# Patient Record
Sex: Female | Born: 1945 | Race: White | Hispanic: No | Marital: Married | Smoking: Never smoker
Health system: Southern US, Community
[De-identification: ages and names within clinical notes are randomized; demographics above are authoritative.]

## PROBLEM LIST (undated history)

## (undated) DIAGNOSIS — K449 Diaphragmatic hernia without obstruction or gangrene: Secondary | ICD-10-CM

## (undated) DIAGNOSIS — K219 Gastro-esophageal reflux disease without esophagitis: Secondary | ICD-10-CM

## (undated) DIAGNOSIS — I4891 Unspecified atrial fibrillation: Secondary | ICD-10-CM

## (undated) DIAGNOSIS — F419 Anxiety disorder, unspecified: Secondary | ICD-10-CM

## (undated) DIAGNOSIS — R51 Headache: Secondary | ICD-10-CM

## (undated) DIAGNOSIS — E785 Hyperlipidemia, unspecified: Secondary | ICD-10-CM

## (undated) DIAGNOSIS — I10 Essential (primary) hypertension: Secondary | ICD-10-CM

## (undated) DIAGNOSIS — N189 Chronic kidney disease, unspecified: Secondary | ICD-10-CM

## (undated) DIAGNOSIS — Z87442 Personal history of urinary calculi: Secondary | ICD-10-CM

## (undated) HISTORY — DX: Diaphragmatic hernia without obstruction or gangrene: K44.9

## (undated) HISTORY — PX: BREAST BIOPSY: SHX20

## (undated) HISTORY — PX: GALLBLADDER SURGERY: SHX652

## (undated) HISTORY — DX: Hyperlipidemia, unspecified: E78.5

## (undated) HISTORY — PX: WISDOM TOOTH EXTRACTION: SHX21

## (undated) HISTORY — DX: Personal history of urinary calculi: Z87.442

## (undated) HISTORY — DX: Gastro-esophageal reflux disease without esophagitis: K21.9

## (undated) HISTORY — PX: CHOLECYSTECTOMY: SHX55

## (undated) HISTORY — DX: Unspecified atrial fibrillation: I48.91

## (undated) HISTORY — PX: CYSTOSCOPY W/ LITHOLAPAXY / EHL: SUR377

## (undated) HISTORY — DX: Essential (primary) hypertension: I10

---

## 1972-10-17 HISTORY — PX: OTHER SURGICAL HISTORY: SHX169

## 1998-10-17 HISTORY — PX: OTHER SURGICAL HISTORY: SHX169

## 1998-10-17 HISTORY — PX: UPPER GASTROINTESTINAL ENDOSCOPY: SHX188

## 1998-11-25 ENCOUNTER — Ambulatory Visit (HOSPITAL_COMMUNITY): Admission: RE | Admit: 1998-11-25 | Discharge: 1998-11-25 | Payer: Self-pay | Admitting: General Surgery

## 1998-11-25 ENCOUNTER — Encounter: Payer: Self-pay | Admitting: General Surgery

## 1998-12-01 ENCOUNTER — Encounter: Payer: Self-pay | Admitting: Gastroenterology

## 1998-12-01 ENCOUNTER — Ambulatory Visit (HOSPITAL_COMMUNITY): Admission: RE | Admit: 1998-12-01 | Discharge: 1998-12-01 | Payer: Self-pay | Admitting: Gastroenterology

## 1998-12-10 ENCOUNTER — Inpatient Hospital Stay (HOSPITAL_COMMUNITY): Admission: RE | Admit: 1998-12-10 | Discharge: 1998-12-15 | Payer: Self-pay | Admitting: General Surgery

## 1999-01-25 ENCOUNTER — Encounter: Payer: Self-pay | Admitting: General Surgery

## 1999-01-25 ENCOUNTER — Ambulatory Visit (HOSPITAL_COMMUNITY): Admission: RE | Admit: 1999-01-25 | Discharge: 1999-01-25 | Payer: Self-pay | Admitting: General Surgery

## 2000-02-07 ENCOUNTER — Other Ambulatory Visit: Admission: RE | Admit: 2000-02-07 | Discharge: 2000-02-07 | Payer: Self-pay | Admitting: General Surgery

## 2005-04-16 HISTORY — PX: OTHER SURGICAL HISTORY: SHX169

## 2011-03-03 ENCOUNTER — Other Ambulatory Visit (INDEPENDENT_AMBULATORY_CARE_PROVIDER_SITE_OTHER): Payer: Self-pay | Admitting: General Surgery

## 2011-03-03 DIAGNOSIS — R1900 Intra-abdominal and pelvic swelling, mass and lump, unspecified site: Secondary | ICD-10-CM

## 2011-03-09 ENCOUNTER — Ambulatory Visit
Admission: RE | Admit: 2011-03-09 | Discharge: 2011-03-09 | Disposition: A | Discharge status: Home / Self Care | Payer: Medicare Other | Source: Ambulatory Visit | Attending: General Surgery | Admitting: General Surgery

## 2011-03-09 DIAGNOSIS — R1900 Intra-abdominal and pelvic swelling, mass and lump, unspecified site: Secondary | ICD-10-CM

## 2011-03-09 MED ORDER — IOHEXOL 300 MG/ML  SOLN
100.0000 mL | Freq: Once | INTRAMUSCULAR | Status: AC | PRN
  Administered 2011-03-09: 100 mL via INTRAVENOUS

## 2011-03-12 ENCOUNTER — Other Ambulatory Visit: Payer: Self-pay | Admitting: Cardiovascular Disease

## 2011-03-15 ENCOUNTER — Other Ambulatory Visit: Payer: Self-pay | Admitting: *Deleted

## 2011-03-15 ENCOUNTER — Other Ambulatory Visit: Payer: Self-pay | Admitting: Cardiovascular Disease

## 2011-03-15 MED ORDER — NEBIVOLOL HCL 10 MG PO TABS: 10.0000 mg | ORAL_TABLET | Freq: Every day | ORAL | Status: DC

## 2011-03-15 NOTE — Telephone Encounter (Signed)
CALL PT, SHE HAS BEEN TRYING TO FILL HER BP MED AND NOT SURE IF ITS TIME FOR HER TO COME BACK IN FOR AN OV. CHART IN BOX.

## 2011-03-15 NOTE — Telephone Encounter (Signed)
Pt having lab work done at PCP tomorrow and will forward lab work to [place]. Pt will call back to set up app with in next two months. Script refilled. Alfonso Ramus RN

## 2011-03-15 NOTE — Telephone Encounter (Signed)
Pt called needs app./ made.Alfonso Ramus RN

## 2011-04-08 ENCOUNTER — Encounter: Payer: Self-pay | Admitting: *Deleted

## 2011-04-08 DIAGNOSIS — K449 Diaphragmatic hernia without obstruction or gangrene: Secondary | ICD-10-CM | POA: Insufficient documentation

## 2011-04-08 DIAGNOSIS — I4891 Unspecified atrial fibrillation: Secondary | ICD-10-CM | POA: Insufficient documentation

## 2011-04-08 DIAGNOSIS — Z87442 Personal history of urinary calculi: Secondary | ICD-10-CM | POA: Insufficient documentation

## 2011-04-08 DIAGNOSIS — K219 Gastro-esophageal reflux disease without esophagitis: Secondary | ICD-10-CM | POA: Insufficient documentation

## 2011-04-15 ENCOUNTER — Encounter: Payer: Self-pay | Admitting: Cardiovascular Disease

## 2011-04-15 ENCOUNTER — Ambulatory Visit (INDEPENDENT_AMBULATORY_CARE_PROVIDER_SITE_OTHER): Payer: Medicare Other | Admitting: Cardiovascular Disease

## 2011-04-15 VITALS — BP 122/92 | HR 63 | Ht 62.5 in | Wt 163.4 lb

## 2011-04-15 DIAGNOSIS — I4891 Unspecified atrial fibrillation: Secondary | ICD-10-CM

## 2011-04-15 MED ORDER — NIACIN ER (ANTIHYPERLIPIDEMIC) 1000 MG PO TBCR: 1000.0000 mg | EXTENDED_RELEASE_TABLET | Freq: Every day | ORAL | Status: DC

## 2011-04-15 MED ORDER — NEBIVOLOL HCL 10 MG PO TABS: 10.0000 mg | ORAL_TABLET | Freq: Every day | ORAL | Status: DC

## 2011-04-15 NOTE — Progress Notes (Signed)
Savannah Tran Date of Birth  1946-01-21 [facility] [address]     [address] [place], [place]  [address] [phone]  Fax  [phone]  History of Present Illness:  65 yo with interminttant A-Fib.  Hypercholesterolemia.  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  No chest pain or dyspnea with exercise.   Hx of HTN . BP is getting better with exercise  Current Outpatient Prescriptions on File Prior to Visit  Medication Sig Dispense Refill  . aspirin 81 MG tablet Take 81 mg by mouth daily. Taking 2 at HS       . atorvastatin (LIPITOR) 20 MG tablet Take 20 mg by mouth daily.        Marland Kitchen BYSTOLIC 10 MG tablet TAKE 1 TABLET BY MOUTH DAILY  90 tablet  1  . Calcium Carbonate-Vitamin D (CALCIUM 600 + D PO) Take by mouth. Taking 2 daily       . fluticasone (VERAMYST) 27.5 MCG/SPRAY nasal spray Place 2 sprays into the nose daily.        . niacin (NIASPAN) 500 MG CR tablet Take 1,000 mg by mouth at bedtime.       . Omega-3 Fatty Acids (FISH OIL PO) Take by mouth.        . DISCONTD: meloxicam (MOBIC) 15 MG tablet Take 15 mg by mouth daily. Taking as needed         Allergies  Allergen Reactions  . Erythromycin     GI issues    Past Medical History  Diagnosis Date  . Atrial fibrillation with rapid ventricular response   . Hyperlipidemia   . Thyroid disease   . Hypertension   . Gastroesophageal reflux disease   . Hiatal hernia   . History of kidney stones     Past Surgical History  Procedure Date  . Gallbladder surgery 1995 or 1996    History  Smoking status  . Never Smoker   Smokeless tobacco  . Not on file    History  Alcohol Use No    Family History  Problem Relation Age of Onset  . Heart attack Mother   . Heart disease Father   . Skin cancer Father     Reviw of Systems:  Reviewed in the HPI.  All other systems are negative.  Physical Exam: BP 122/92  Pulse 63  Ht 5'  2.5" (1.588 m)  Wt 163 lb 6.4 oz (74.118 kg)  BMI 29.41 kg/m2 The patient is alert and oriented x 3.  The mood and affect are normal.   Skin: warm and dry.  Color is normal.    HEENT:   the sclera are nonicteric.  The mucous membranes are moist.  The carotids are 2+ without bruits.  There is no thyromegaly.  There is no JVD.    Lungs: clear.  The chest wall is non tender.    Heart: regular rate with a normal S1 and S2.  There are no murmurs, gallops, or rubs. The PMI is not displaced.     Abdomin: good bowel sounds.  There is no guarding or rebound.  There is no hepatosplenomegaly or tenderness.  There are no masses.   Extremities:  no clubbing, cyanosis, or edema.  The legs are without rashes.  The distal pulses are intact.   Neuro:  Cranial nerves II - XII are intact.  Motor and sensory functions are intact.  The gait is normal.  ECG: Normal sinus rhythm. She has no ST or T wave changes.  Assessment / Plan:

## 2011-04-15 NOTE — Assessment & Plan Note (Signed)
Savannah Tran has done very well from a Atrial fibrillation standpoint. She remains in normal sinus rhythm. We'll continue with her same medications and I'll see her again in 6 months.

## 2011-04-18 ENCOUNTER — Encounter: Payer: Self-pay | Admitting: Cardiovascular Disease

## 2011-09-22 ENCOUNTER — Other Ambulatory Visit: Payer: Self-pay | Admitting: Obstetrics and Gynecology

## 2011-09-22 ENCOUNTER — Encounter (HOSPITAL_COMMUNITY): Payer: Self-pay | Admitting: Pharmacist

## 2011-09-26 NOTE — Patient Instructions (Addendum)
   Your procedure is scheduled on: Friday, Dec 14th  Enter through the [employer] of [facility] at: 10:30am Pick up the phone at the desk and dial [phone] and inform us of your arrival.  Please call this number if you have any problems the morning of surgery: [phone]  Remember: Do not eat food after midnight: Thursday Do not drink clear liquids after: 8:00am Friday Take these medicines the morning of surgery with a SIP OF WATER: Lisinopril, Toprol-XL, Prilosec   Do not wear jewelry, make-up, or FINGER nail polish Do not wear lotions, powders, perfumes or deodorant. Do not shave 48 hours prior to surgery. Do not bring valuables to the hospital.  Patients discharged on the day of surgery will not be allowed to drive home.   Home with Margar[facility] - Friend  Remember to use your hibiclens as instructed.Please shower with 1/2 bottle the evening before your surgery and the other 1/2 bottle the morning of surgery.

## 2011-09-27 ENCOUNTER — Encounter (HOSPITAL_COMMUNITY): Payer: Self-pay

## 2011-09-27 ENCOUNTER — Encounter (HOSPITAL_COMMUNITY)
Admission: RE | Admit: 2011-09-27 | Discharge: 2011-09-27 | Disposition: A | Discharge status: Home / Self Care | Payer: Medicare Other | Source: Ambulatory Visit | Attending: Obstetrics and Gynecology | Admitting: Obstetrics and Gynecology

## 2011-09-27 HISTORY — DX: Chronic kidney disease, unspecified: N18.9

## 2011-09-27 HISTORY — DX: Anxiety disorder, unspecified: F41.9

## 2011-09-27 HISTORY — DX: Headache: R51

## 2011-09-27 LAB — BASIC METABOLIC PANEL
Calcium: 9.9 mg/dL (ref 8.4–10.5)
GFR calc non Af Amer: 87 mL/min — ABNORMAL LOW (ref >90)
Sodium: 140 mEq/L (ref 135–145)

## 2011-09-27 LAB — CBC
Platelets: 224 10*3/uL (ref 150–400)
RBC: 4.34 MIL/uL (ref 3.87–5.11)
RDW: 14.6 % (ref 11.5–15.5)
WBC: 6.5 10*3/uL (ref 4.0–10.5)

## 2011-09-27 NOTE — Pre-Procedure Instructions (Signed)
Ok TO see patient DOS

## 2011-09-30 ENCOUNTER — Ambulatory Visit (HOSPITAL_COMMUNITY): Payer: Medicare Other | Admitting: Anesthesiology

## 2011-09-30 ENCOUNTER — Other Ambulatory Visit: Payer: Self-pay | Admitting: Obstetrics and Gynecology

## 2011-09-30 ENCOUNTER — Encounter (HOSPITAL_COMMUNITY): Payer: Self-pay | Admitting: Anesthesiology

## 2011-09-30 ENCOUNTER — Encounter (HOSPITAL_COMMUNITY)
Admission: RE | Disposition: A | Discharge status: Home / Self Care | Payer: Self-pay | Source: Ambulatory Visit | Attending: Obstetrics and Gynecology

## 2011-09-30 ENCOUNTER — Ambulatory Visit (HOSPITAL_COMMUNITY)
Admission: RE | Admit: 2011-09-30 | Discharge: 2011-09-30 | Disposition: A | Discharge status: Home / Self Care | Payer: Medicare Other | Source: Ambulatory Visit | Attending: Obstetrics and Gynecology | Admitting: Obstetrics and Gynecology

## 2011-09-30 DIAGNOSIS — S3760XA Unspecified injury of uterus, initial encounter: Secondary | ICD-10-CM | POA: Insufficient documentation

## 2011-09-30 DIAGNOSIS — Y9210 Unspecified residential institution as the place of occurrence of the external cause: Secondary | ICD-10-CM | POA: Insufficient documentation

## 2011-09-30 DIAGNOSIS — Z01812 Encounter for preprocedural laboratory examination: Secondary | ICD-10-CM | POA: Insufficient documentation

## 2011-09-30 DIAGNOSIS — R9389 Abnormal findings on diagnostic imaging of other specified body structures: Secondary | ICD-10-CM | POA: Insufficient documentation

## 2011-09-30 DIAGNOSIS — IMO0002 Reserved for concepts with insufficient information to code with codable children: Secondary | ICD-10-CM | POA: Insufficient documentation

## 2011-09-30 DIAGNOSIS — Z9889 Other specified postprocedural states: Secondary | ICD-10-CM

## 2011-09-30 DIAGNOSIS — N840 Polyp of corpus uteri: Secondary | ICD-10-CM | POA: Insufficient documentation

## 2011-09-30 DIAGNOSIS — Z01818 Encounter for other preprocedural examination: Secondary | ICD-10-CM | POA: Insufficient documentation

## 2011-09-30 HISTORY — PX: LAPAROSCOPY: SHX197

## 2011-09-30 HISTORY — PX: HYSTEROSCOPY WITH D & C: SHX1775

## 2011-09-30 SURGERY — DILATATION AND CURETTAGE /HYSTEROSCOPY
Anesthesia: General | Site: Vagina

## 2011-09-30 MED ORDER — DEXAMETHASONE SODIUM PHOSPHATE 10 MG/ML IJ SOLN
INTRAMUSCULAR | Status: AC
  Filled 2011-09-30: qty 1

## 2011-09-30 MED ORDER — PROPOFOL 10 MG/ML IV EMUL
INTRAVENOUS | Status: AC
  Filled 2011-09-30: qty 20

## 2011-09-30 MED ORDER — ONDANSETRON HCL 4 MG/2ML IJ SOLN
INTRAMUSCULAR | Status: AC
  Filled 2011-09-30: qty 2

## 2011-09-30 MED ORDER — GLYCOPYRROLATE 0.2 MG/ML IJ SOLN
INTRAMUSCULAR | Status: DC | PRN
  Administered 2011-09-30: .4 mg via INTRAVENOUS

## 2011-09-30 MED ORDER — KETOROLAC TROMETHAMINE 30 MG/ML IJ SOLN
INTRAMUSCULAR | Status: DC | PRN
  Administered 2011-09-30: 30 mg via INTRAVENOUS

## 2011-09-30 MED ORDER — OXYCODONE-ACETAMINOPHEN 5-325 MG PO TABS: 1.0000 | ORAL_TABLET | ORAL | Status: AC | PRN

## 2011-09-30 MED ORDER — OXYCODONE-ACETAMINOPHEN 5-325 MG PO TABS
ORAL_TABLET | ORAL | Status: AC
  Filled 2011-09-30: qty 1

## 2011-09-30 MED ORDER — MIDAZOLAM HCL 5 MG/5ML IJ SOLN
INTRAMUSCULAR | Status: DC | PRN
  Administered 2011-09-30: 2 mg via INTRAVENOUS

## 2011-09-30 MED ORDER — KETOROLAC TROMETHAMINE 30 MG/ML IJ SOLN
INTRAMUSCULAR | Status: AC
  Filled 2011-09-30: qty 1

## 2011-09-30 MED ORDER — GLYCOPYRROLATE 0.2 MG/ML IJ SOLN
INTRAMUSCULAR | Status: AC
  Filled 2011-09-30: qty 1

## 2011-09-30 MED ORDER — NEOSTIGMINE METHYLSULFATE 1 MG/ML IJ SOLN
INTRAMUSCULAR | Status: DC | PRN
  Administered 2011-09-30: 3 mg via INTRAVENOUS

## 2011-09-30 MED ORDER — LACTATED RINGERS IV SOLN
INTRAVENOUS | Status: DC
  Administered 2011-09-30 (×3): via INTRAVENOUS

## 2011-09-30 MED ORDER — ROCURONIUM BROMIDE 50 MG/5ML IV SOLN
INTRAVENOUS | Status: AC
  Filled 2011-09-30: qty 1

## 2011-09-30 MED ORDER — GLYCINE 1.5 % IR SOLN
Status: DC | PRN
  Administered 2011-09-30: 3000 mL

## 2011-09-30 MED ORDER — MIDAZOLAM HCL 2 MG/2ML IJ SOLN
INTRAMUSCULAR | Status: AC
  Filled 2011-09-30: qty 2

## 2011-09-30 MED ORDER — LACTATED RINGERS IV SOLN: INTRAVENOUS | Status: DC

## 2011-09-30 MED ORDER — LIDOCAINE HCL (CARDIAC) 20 MG/ML IV SOLN
INTRAVENOUS | Status: AC
  Filled 2011-09-30: qty 5

## 2011-09-30 MED ORDER — DEXAMETHASONE SODIUM PHOSPHATE 10 MG/ML IJ SOLN
INTRAMUSCULAR | Status: DC | PRN
  Administered 2011-09-30: 10 mg via INTRAVENOUS

## 2011-09-30 MED ORDER — OXYCODONE-ACETAMINOPHEN 5-325 MG PO TABS: 1.0000 | ORAL_TABLET | ORAL | Status: DC | PRN

## 2011-09-30 MED ORDER — NEOSTIGMINE METHYLSULFATE 1 MG/ML IJ SOLN
INTRAMUSCULAR | Status: AC
  Filled 2011-09-30: qty 10

## 2011-09-30 MED ORDER — ONDANSETRON HCL 4 MG/2ML IJ SOLN
INTRAMUSCULAR | Status: DC | PRN
  Administered 2011-09-30: 4 mg via INTRAVENOUS

## 2011-09-30 MED ORDER — FENTANYL CITRATE 0.05 MG/ML IJ SOLN
INTRAMUSCULAR | Status: AC
  Filled 2011-09-30: qty 2

## 2011-09-30 MED ORDER — ROCURONIUM BROMIDE 100 MG/10ML IV SOLN
INTRAVENOUS | Status: DC | PRN
  Administered 2011-09-30: 30 mg via INTRAVENOUS

## 2011-09-30 MED ORDER — PROPOFOL 10 MG/ML IV EMUL
INTRAVENOUS | Status: DC | PRN
  Administered 2011-09-30: 200 mg via INTRAVENOUS

## 2011-09-30 MED ORDER — LIDOCAINE HCL (CARDIAC) 20 MG/ML IV SOLN
INTRAVENOUS | Status: DC | PRN
  Administered 2011-09-30: 100 mg via INTRAVENOUS

## 2011-09-30 MED ORDER — LACTATED RINGERS IR SOLN
Status: DC | PRN
  Administered 2011-09-30: 3000 mL

## 2011-09-30 SURGICAL SUPPLY — 21 items
ABLATOR ENDOMETRIAL BIPOLAR (ABLATOR) IMPLANT
ADH SKN CLS APL DERMABOND .7 (GAUZE/BANDAGES/DRESSINGS) ×2
CANISTER SUCTION 2500CC (MISCELLANEOUS) ×3 IMPLANT
CATH ROBINSON RED A/P 16FR (CATHETERS) ×3 IMPLANT
CLOTH BEACON ORANGE TIMEOUT ST (SAFETY) ×3 IMPLANT
CONTAINER PREFILL 10% NBF 60ML (FORM) ×6 IMPLANT
DERMABOND ADVANCED (GAUZE/BANDAGES/DRESSINGS) ×1
DERMABOND ADVANCED .7 DNX12 (GAUZE/BANDAGES/DRESSINGS) IMPLANT
ELECT REM PT RETURN 9FT ADLT (ELECTROSURGICAL)
ELECTRODE REM PT RTRN 9FT ADLT (ELECTROSURGICAL) IMPLANT
GLOVE BIO SURGEON STRL SZ7 (GLOVE) ×6 IMPLANT
GOWN PREVENTION PLUS LG XLONG (DISPOSABLE) ×6 IMPLANT
GOWN STRL REIN XL XLG (GOWN DISPOSABLE) ×3 IMPLANT
LOOP ANGLED CUTTING 22FR (CUTTING LOOP) IMPLANT
PACK HYSTEROSCOPY LF (CUSTOM PROCEDURE TRAY) ×3 IMPLANT
PACK LAPAROSCOPY BASIN (CUSTOM PROCEDURE TRAY) ×1 IMPLANT
SET IRRIG TUBING LAPAROSCOPIC (IRRIGATION / IRRIGATOR) ×1 IMPLANT
TOWEL OR 17X24 6PK STRL BLUE (TOWEL DISPOSABLE) ×6 IMPLANT
TROCAR XCEL NON-BLD 11X100MML (ENDOMECHANICALS) ×1 IMPLANT
TROCAR XCEL NON-BLD 5MMX100MML (ENDOMECHANICALS) ×2 IMPLANT
WATER STERILE IRR 1000ML POUR (IV SOLUTION) ×2 IMPLANT

## 2011-09-30 NOTE — Op Note (Signed)
09/30/2011  1:13 PM  PATIENT:  Savannah Tran  65 y.o. female  PRE-OPERATIVE DIAGNOSIS:  thickened endometrium  POST-OPERATIVE DIAGNOSIS:  thickened endometrium, perforated uterus  PROCEDURE:  Procedure(s): DILATATION AND CURETTAGE /HYSTEROSCOPY LAPAROSCOPY DIAGNOSTIC  SURGEON:  Surgeon(s): Darlisha Kelm A Jacquiline Zurcher  PHYSICIAN ASSISTANT: none  ANESTHESIA:   LMA converted to ET  EBL:  Total I/O In: 2200 [I.V.:2200] Out: 100 [Blood:100]  BLOOD ADMINISTERED:none   SPECIMEN:  Source of Specimen:  uterine polyp and currettings  DISPOSITION OF SPECIMEN:  PATHOLOGY  COUNTS:  YES  DICTATION: see below  PLAN OF CARE: Discharge to home after PACU  PATIENT DISPOSITION:  PACU - hemodynamically stable.   Delay start of Pharmacological VTE agent (>24hrs) due to surgical blood loss or risk of bleeding:  {YES/NO/NOT APPLICABLE:20182  Complications: none  Medications:  None  Findings:  Apparently normal pelvis.  No lesions or adhesions seen on any structures, including the ovaries, peritoneum under ovaries, uterus, tubes, cul de sac, anterior bladder or anterior abdominal wall.  The liver edge, diaphragm and appendix were all normal.  The tubes were normal caliber and had normal fimbriae and there was excellent spillage from both tubes.       After adequate anesthesia was achieved, the patient was prepped and draped in the usual sterile fashion.  The speculum was placed in the vagina and the cervix stabilized with a single-tooth tenaculum.  The cervix was dilated with pratt dilators and the hysteroscope passed inside the endometrial cavity.   A large fundal polyp was seen and polyp forceps were introduced and attempted to remove polyp.  At one point the forceps were used to twist the polyp but it did not come off easily.  Finally after alternating hysteroscopy and polyp forceps, the polyp was able to be removed.  When the hysteroscope was reintroduced, it was then apparent that  there was a fundal perforation and bowel could be visualized with the scope thought the perforation.  Because I couldn't be absolutely sure where I had been with the polyp forceps, I decided to do a diagnostic laparoscopy to inspect the bowel and peritoneal cavity.   All broke scrub and the patient was intubated.  She was the reprepped and draped in a sterile fashion.  A Kohn cannula was placed in the cervix and secured with the single tooth tenaculum.  The bladder was drained with a foley.  A two cm incision was made below the umbilicus and the veress needle passed inside without aspiration of bowel contents or blood.  The 10 mm trocar was placed without comp after the abdomin had been insufflated.  The camera was placed inside and a 8.5 mm trocar placed 3 above the right iliac crest under direct visualization of the camera.  A careful and systematic exploration was performed and the above findings were noted.  The sigmoid, rectum and small bowel all appeared intact and without bleeding.  The adhesion of the Left tube to the sigmoid was dense and clearly a long standing adhesion.  The fibrous adhesion did not give way to gentle dissection with the probe and was left in place.  The fundal perforation was only 1 cm and bleeding was controlled with bipolar cautery of the site.  The instruments were removed from the abdomen and the abdomen desufflated.  The 10 mm trocar fascia was closed with a figure of eight stitch and the suprapubic skin incision closed with 2-0vicryl.  The incisions were closed with dermabond.  The patient tolerated the   procedure well and was returned to the recovery room in stable condition.    [name]          

## 2011-09-30 NOTE — Brief Op Note (Signed)
09/30/2011  1:13 PM  PATIENT:  Savannah Tran  65 y.o. female  PRE-OPERATIVE DIAGNOSIS:  thickened endometrium  POST-OPERATIVE DIAGNOSIS:  thickened endometrium, perforated uterus  PROCEDURE:  Procedure(s): DILATATION AND CURETTAGE /HYSTEROSCOPY LAPAROSCOPY DIAGNOSTIC  SURGEON:  Surgeon(s): Mikah Poss A Hanae Waiters  PHYSICIAN ASSISTANT: none  ANESTHESIA:   LMA converted to ET  EBL:  Total I/O In: 2200 [I.V.:2200] Out: 100 [Blood:100]  BLOOD ADMINISTERED:none   SPECIMEN:  Source of Specimen:  uterine polyp and currettings  DISPOSITION OF SPECIMEN:  PATHOLOGY  COUNTS:  YES  DICTATION: see below  PLAN OF CARE: Discharge to home after PACU  PATIENT DISPOSITION:  PACU - hemodynamically stable.   Delay start of Pharmacological VTE agent (>24hrs) due to surgical blood loss or risk of bleeding:  {YES/NO/NOT APPLICABLE:20182  Complications: none  Medications:  None  Findings:  Apparently normal pelvis.  No lesions or adhesions seen on any structures, including the ovaries, peritoneum under ovaries, uterus, tubes, cul de sac, anterior bladder or anterior abdominal wall.  The liver edge, diaphragm and appendix were all normal.  The tubes were normal caliber and had normal fimbriae and there was excellent spillage from both tubes.       After adequate anesthesia was achieved, the patient was prepped and draped in the usual sterile fashion.  The speculum was placed in the vagina and the cervix stabilized with a single-tooth tenaculum.  The cervix was dilated with pratt dilators and the hysteroscope passed inside the endometrial cavity.   A large fundal polyp was seen and polyp forceps were introduced and attempted to remove polyp.  At one point the forceps were used to twist the polyp but it did not come off easily.  Finally after alternating hysteroscopy and polyp forceps, the polyp was able to be removed.  When the hysteroscope was reintroduced, it was then apparent that  there was a fundal perforation and bowel could be visualized with the scope thought the perforation.  Because I couldn't be absolutely sure where I had been with the polyp forceps, I decided to do a diagnostic laparoscopy to inspect the bowel and peritoneal cavity.   All broke scrub and the patient was intubated.  She was the reprepped and draped in a sterile fashion.  A Kohn cannula was placed in the cervix and secured with the single tooth tenaculum.  The bladder was drained with a foley.  A two cm incision was made below the umbilicus and the veress needle passed inside without aspiration of bowel contents or blood.  The 10 mm trocar was placed without comp after the abdomin had been insufflated.  The camera was placed inside and a 8.5 mm trocar placed 3 above the right iliac crest under direct visualization of the camera.  A careful and systematic exploration was performed and the above findings were noted.  The sigmoid, rectum and small bowel all appeared intact and without bleeding.  The adhesion of the Left tube to the sigmoid was dense and clearly a long standing adhesion.  The fibrous adhesion did not give way to gentle dissection with the probe and was left in place.  The fundal perforation was only 1 cm and bleeding was controlled with bipolar cautery of the site.  The instruments were removed from the abdomen and the abdomen desufflated.  The 10 mm trocar fascia was closed with a figure of eight stitch and the suprapubic skin incision closed with 2-0vicryl.  The incisions were closed with dermabond.  The patient tolerated the  procedure well and was returned to the recovery room in stable condition.    Iasha Mccalister A

## 2011-09-30 NOTE — Transfer of Care (Signed)
Immediate Anesthesia Transfer of Care Note  Patient: Savannah Tran  Procedure(s) Performed:  DILATATION AND CURETTAGE /HYSTEROSCOPY; LAPAROSCOPY DIAGNOSTIC  Patient Location: PACU  Anesthesia Type: General  Level of Consciousness: awake  Airway & Oxygen Therapy: Patient Spontanous Breathing and Patient connected to nasal cannula oxygen  Post-op Assessment: Report given to PACU RN  Post vital signs: Reviewed and stable  Complications: No apparent anesthesia complications

## 2011-09-30 NOTE — Anesthesia Preprocedure Evaluation (Signed)
Anesthesia Evaluation  Patient identified by MRN, date of birth, ID band Patient awake    Reviewed: Allergy & Precautions, H&P , Patient's Chart, lab work & pertinent test results, reviewed documented beta blocker date and time   Airway Mallampati: II TM Distance: >3 FB Neck ROM: full    Dental No notable dental hx.    Pulmonary  clear to auscultation  Pulmonary exam normal       Cardiovascular hypertension (well controlled, EKG copy okay), On Medications and On Home Beta Blockers - dysrhythmias (currenrtly RRR, pt not aware she ever had Afib. On BBlocker, took it today) regular Normal    Neuro/Psych    GI/Hepatic GERD-  Medicated,  Endo/Other    Renal/GU      Musculoskeletal   Abdominal   Peds  Hematology   Anesthesia Other Findings   Reproductive/Obstetrics                           Anesthesia Physical Anesthesia Plan  ASA: II  Anesthesia Plan: General   Post-op Pain Management:    Induction: Intravenous  Airway Management Planned: LMA  Additional Equipment:   Intra-op Plan:   Post-operative Plan:   Informed Consent: I have reviewed the patients History and Physical, chart, labs and discussed the procedure including the risks, benefits and alternatives for the proposed anesthesia with the patient or authorized representative who has indicated his/her understanding and acceptance.   Dental Advisory Given  Plan Discussed with: CRNA and Surgeon  Anesthesia Plan Comments: (  Discussed  general anesthesia, including possible nausea, instrumentation of airway, sore throat,pulmonary aspiration, etc. I asked if the were any outstanding questions, or  concerns before we proceeded. )        Anesthesia Quick Evaluation

## 2011-09-30 NOTE — H&P (Signed)
65 y.o. yo had u/ for lower abdominal pain and found to have a endometrial lining of 1cm.  EMB in the office returned only endocervical cells.  She has had no bleeding.  Past Medical History  Diagnosis Date  . Hyperlipidemia   . Hypertension   . Gastroesophageal reflux disease   . Hiatal hernia   . History of kidney stones   . Chronic kidney disease     lithotripsy - kidney stones  . Headache     history - non since menopausal  . Anxiety     no meds  . Atrial fibrillation with rapid ventricular response     R/T HCTZ  med reaction/ALLERGIC TO MED   Past Surgical History  Procedure Date  . Gallbladder surgery 1995 or 1996  . Cholecystectomy   . Fluid sac 2000    surgical removal of fluid sac at incision site from gall bladder surgery  . Upper gastrointestinal endoscopy 2000  . Colonscopy 04/2005  . Wisdom tooth extraction   . Svd 1974    x 1  . Cystoscopy w/ litholapaxy / ehl     History   Social History  . Marital Status: Married    Spouse Name: N/A    Number of Children: N/A  . Years of Education: N/A   Occupational History  . Not on file.   Social History Main Topics  . Smoking status: Never Smoker   . Smokeless tobacco: Never Used  . Alcohol Use: No  . Drug Use: No  . Sexually Active: Not Currently   Other Topics Concern  . Not on file   Social History Narrative  . No narrative on file    No current facility-administered medications on file prior to encounter.   Current Outpatient Prescriptions on File Prior to Encounter  Medication Sig Dispense Refill  . Calcium Carbonate-Vitamin D (CALCIUM 600 + D PO) Take by mouth. Taking 2 daily       . fluticasone (VERAMYST) 27.5 MCG/SPRAY nasal spray Place 2 sprays into the nose daily.        . Multiple Vitamin (MULTIVITAMIN) tablet Take 1 tablet by mouth daily.        Marland Kitchen omeprazole (PRILOSEC OTC) 20 MG tablet Take 20 mg by mouth every other day.       Marland Kitchen aspirin 81 MG tablet Take 81 mg by mouth daily. Taking 2 at  HS        Allergies  Allergen Reactions  . Erythromycin     GI issues  . Hctz (Hydrochlorothiazide) Palpitations    Filed Vitals:   09/30/11 1041  BP: 152/71  Pulse: 65  Temp: 98.6 F (37 C)  TempSrc: Oral  Resp: 18  SpO2: 99%    Lungs: clear to ascultation Cor:  RRR Abdomen:  soft, nontender, nondistended. Ex:  no cords, erythema Pelvic:  NEFG, nml size shape uterus; difficult exam.  Lab Results  Component Value Date   WBC 6.5 09/27/2011   HGB 12.5 09/27/2011   HCT 39.0 09/27/2011   MCV 89.9 09/27/2011   PLT 224 09/27/2011     U/S 5.35x3.1x2.4, 1.1 cm endostripe with cystic areas.  Ovaries not seen.  A:  Thickened endometrium at 65 yo and PMP; unable to sample in office.   P:  For D&C.   All risks, benefits and alternatives d/w patient and she desires to proceed.     Serenidy Waltz A

## 2011-09-30 NOTE — Anesthesia Postprocedure Evaluation (Signed)
  Anesthesia Post-op Note  Patient: Savannah Tran  Procedure(s) Performed:  DILATATION AND CURETTAGE /HYSTEROSCOPY; LAPAROSCOPY DIAGNOSTIC  Patient Location: PACU  Anesthesia Type: General  Level of Consciousness: awake, alert  and oriented  Airway and Oxygen Therapy: Patient Spontanous Breathing  Post-op Pain: mild  Post-op Assessment: Post-op Vital signs reviewed, Patient's Cardiovascular Status Stable, Respiratory Function Stable, Patent Airway, No signs of Nausea or vomiting and Pain level controlled  Post-op Vital Signs: Reviewed and stable  Complications: No apparent anesthesia complications

## 2011-09-30 NOTE — Anesthesia Procedure Notes (Signed)
Procedure Name: LMA Insertion Date/Time: 09/30/2011 12:00 PM Performed by: Shloimy Michalski MARIE Pre-anesthesia Checklist: Patient identified, Patient being monitored, Emergency Drugs available and Timeout performed Patient Re-evaluated:Patient Re-evaluated prior to inductionOxygen Delivery Method: Circle System Utilized Preoxygenation: Pre-oxygenation with 100% oxygen Intubation Type: IV induction and Inhalational induction LMA: LMA inserted LMA Size: 4.0 Number of attempts: 1 Dental Injury: Teeth and Oropharynx as per pre-operative assessment

## 2011-10-03 ENCOUNTER — Encounter (HOSPITAL_COMMUNITY): Payer: Self-pay | Admitting: Obstetrics and Gynecology

## 2015-01-26 ENCOUNTER — Other Ambulatory Visit: Payer: Self-pay | Admitting: Gastroenterology

## 2015-01-26 DIAGNOSIS — IMO0001 Reserved for inherently not codable concepts without codable children: Secondary | ICD-10-CM

## 2015-01-26 DIAGNOSIS — K219 Gastro-esophageal reflux disease without esophagitis: Secondary | ICD-10-CM

## 2015-01-26 DIAGNOSIS — R7989 Other specified abnormal findings of blood chemistry: Secondary | ICD-10-CM

## 2015-01-26 DIAGNOSIS — R945 Abnormal results of liver function studies: Principal | ICD-10-CM

## 2015-02-02 ENCOUNTER — Ambulatory Visit
Admission: RE | Admit: 2015-02-02 | Discharge: 2015-02-02 | Disposition: A | Discharge status: Home / Self Care | Payer: Medicare Other | Source: Ambulatory Visit | Attending: Gastroenterology | Admitting: Gastroenterology

## 2015-02-02 DIAGNOSIS — K219 Gastro-esophageal reflux disease without esophagitis: Secondary | ICD-10-CM

## 2015-02-02 DIAGNOSIS — IMO0001 Reserved for inherently not codable concepts without codable children: Secondary | ICD-10-CM

## 2015-02-02 DIAGNOSIS — R945 Abnormal results of liver function studies: Principal | ICD-10-CM

## 2015-02-02 DIAGNOSIS — R7989 Other specified abnormal findings of blood chemistry: Secondary | ICD-10-CM

## 2015-09-23 ENCOUNTER — Ambulatory Visit
Admission: RE | Admit: 2015-09-23 | Discharge: 2015-09-23 | Disposition: A | Discharge status: Home / Self Care | Payer: Medicare Other | Source: Ambulatory Visit | Attending: Obstetrics and Gynecology | Admitting: Obstetrics and Gynecology

## 2015-09-23 ENCOUNTER — Other Ambulatory Visit: Payer: Self-pay | Admitting: Obstetrics and Gynecology

## 2015-09-23 DIAGNOSIS — N6320 Unspecified lump in the left breast, unspecified quadrant: Secondary | ICD-10-CM

## 2015-09-24 LAB — CYTOLOGY - PAP

## 2016-08-11 ENCOUNTER — Other Ambulatory Visit: Payer: Self-pay | Admitting: Obstetrics and Gynecology

## 2016-08-11 DIAGNOSIS — Z1231 Encounter for screening mammogram for malignant neoplasm of breast: Secondary | ICD-10-CM

## 2016-09-19 ENCOUNTER — Ambulatory Visit
Admission: RE | Admit: 2016-09-19 | Discharge: 2016-09-19 | Disposition: A | Discharge status: Home / Self Care | Payer: Medicare Other | Source: Ambulatory Visit | Attending: Obstetrics and Gynecology | Admitting: Obstetrics and Gynecology

## 2016-09-19 DIAGNOSIS — Z1231 Encounter for screening mammogram for malignant neoplasm of breast: Secondary | ICD-10-CM

## 2017-08-17 ENCOUNTER — Other Ambulatory Visit: Payer: Self-pay | Admitting: Obstetrics and Gynecology

## 2017-08-17 DIAGNOSIS — Z139 Encounter for screening, unspecified: Secondary | ICD-10-CM

## 2017-09-20 ENCOUNTER — Ambulatory Visit
Admission: RE | Admit: 2017-09-20 | Discharge: 2017-09-20 | Disposition: A | Discharge status: Home / Self Care | Payer: Medicare Other | Source: Ambulatory Visit | Attending: Obstetrics and Gynecology | Admitting: Obstetrics and Gynecology

## 2017-09-20 DIAGNOSIS — Z139 Encounter for screening, unspecified: Secondary | ICD-10-CM

## 2018-08-20 ENCOUNTER — Other Ambulatory Visit: Payer: Self-pay | Admitting: Obstetrics and Gynecology

## 2018-08-20 DIAGNOSIS — Z1231 Encounter for screening mammogram for malignant neoplasm of breast: Secondary | ICD-10-CM

## 2018-10-01 ENCOUNTER — Ambulatory Visit
Admission: RE | Admit: 2018-10-01 | Discharge: 2018-10-01 | Disposition: A | Discharge status: Home / Self Care | Payer: Medicare Other | Source: Ambulatory Visit | Attending: Obstetrics and Gynecology | Admitting: Obstetrics and Gynecology

## 2018-10-01 DIAGNOSIS — Z1231 Encounter for screening mammogram for malignant neoplasm of breast: Secondary | ICD-10-CM

## 2019-08-27 ENCOUNTER — Other Ambulatory Visit: Payer: Self-pay | Admitting: Obstetrics and Gynecology

## 2019-08-27 DIAGNOSIS — Z1231 Encounter for screening mammogram for malignant neoplasm of breast: Secondary | ICD-10-CM

## 2019-10-17 ENCOUNTER — Other Ambulatory Visit: Payer: Self-pay

## 2019-10-17 ENCOUNTER — Ambulatory Visit
Admission: RE | Admit: 2019-10-17 | Discharge: 2019-10-17 | Disposition: A | Discharge status: Home / Self Care | Payer: Medicare Other | Source: Ambulatory Visit | Attending: Obstetrics and Gynecology | Admitting: Obstetrics and Gynecology

## 2019-10-17 DIAGNOSIS — Z1231 Encounter for screening mammogram for malignant neoplasm of breast: Secondary | ICD-10-CM

## 2020-08-03 ENCOUNTER — Other Ambulatory Visit: Payer: Self-pay | Admitting: Gastroenterology

## 2020-08-03 DIAGNOSIS — R0789 Other chest pain: Secondary | ICD-10-CM

## 2020-08-03 DIAGNOSIS — K2100 Gastro-esophageal reflux disease with esophagitis, without bleeding: Secondary | ICD-10-CM

## 2020-08-03 DIAGNOSIS — K449 Diaphragmatic hernia without obstruction or gangrene: Secondary | ICD-10-CM

## 2020-08-11 ENCOUNTER — Ambulatory Visit
Admission: RE | Admit: 2020-08-11 | Discharge: 2020-08-11 | Disposition: A | Discharge status: Home / Self Care | Payer: Medicare Other | Source: Ambulatory Visit | Attending: Gastroenterology | Admitting: Gastroenterology

## 2020-08-11 DIAGNOSIS — R0789 Other chest pain: Secondary | ICD-10-CM

## 2020-08-11 DIAGNOSIS — K2100 Gastro-esophageal reflux disease with esophagitis, without bleeding: Secondary | ICD-10-CM

## 2020-08-11 DIAGNOSIS — K449 Diaphragmatic hernia without obstruction or gangrene: Secondary | ICD-10-CM

## 2020-09-14 ENCOUNTER — Other Ambulatory Visit: Payer: Self-pay | Admitting: Obstetrics and Gynecology

## 2020-09-14 DIAGNOSIS — Z1231 Encounter for screening mammogram for malignant neoplasm of breast: Secondary | ICD-10-CM

## 2020-10-27 ENCOUNTER — Ambulatory Visit
Admission: RE | Admit: 2020-10-27 | Discharge: 2020-10-27 | Disposition: A | Discharge status: Home / Self Care | Payer: Medicare PPO | Source: Ambulatory Visit | Attending: Obstetrics and Gynecology | Admitting: Obstetrics and Gynecology

## 2020-10-27 ENCOUNTER — Other Ambulatory Visit: Payer: Self-pay

## 2020-10-27 DIAGNOSIS — Z1231 Encounter for screening mammogram for malignant neoplasm of breast: Secondary | ICD-10-CM

## 2021-09-21 ENCOUNTER — Other Ambulatory Visit: Payer: Self-pay | Admitting: Obstetrics and Gynecology

## 2021-09-21 DIAGNOSIS — Z1231 Encounter for screening mammogram for malignant neoplasm of breast: Secondary | ICD-10-CM

## 2021-10-28 ENCOUNTER — Ambulatory Visit
Admission: RE | Admit: 2021-10-28 | Discharge: 2021-10-28 | Disposition: A | Discharge status: Home / Self Care | Payer: Medicare PPO | Source: Ambulatory Visit | Attending: Obstetrics and Gynecology | Admitting: Obstetrics and Gynecology

## 2021-10-28 DIAGNOSIS — Z1231 Encounter for screening mammogram for malignant neoplasm of breast: Secondary | ICD-10-CM

## 2022-09-20 ENCOUNTER — Other Ambulatory Visit: Payer: Self-pay | Admitting: Obstetrics and Gynecology

## 2022-09-20 DIAGNOSIS — Z1231 Encounter for screening mammogram for malignant neoplasm of breast: Secondary | ICD-10-CM

## 2022-11-16 ENCOUNTER — Ambulatory Visit
Admission: RE | Admit: 2022-11-16 | Discharge: 2022-11-16 | Disposition: A | Discharge status: Home / Self Care | Payer: Medicare PPO | Source: Ambulatory Visit | Attending: Obstetrics and Gynecology | Admitting: Obstetrics and Gynecology

## 2022-11-16 DIAGNOSIS — Z1231 Encounter for screening mammogram for malignant neoplasm of breast: Secondary | ICD-10-CM

## 2022-11-21 IMAGING — MG MM DIGITAL SCREENING BILAT W/ TOMO AND CAD
6 of 12 series · 6 of 36 positions shown · non-contrast
Comparison: Previous exam(s).

CLINICAL DATA: Screening.

EXAM:
DIGITAL SCREENING BILATERAL MAMMOGRAM WITH TOMOSYNTHESIS AND CAD
TECHNIQUE: Bilateral screening digital craniocaudal and mediolateral oblique
mammograms were obtained. Bilateral screening digital breast
tomosynthesis was performed. The images were evaluated with
computer-aided detection.

[R MLO synth-2D (1 of 2)]
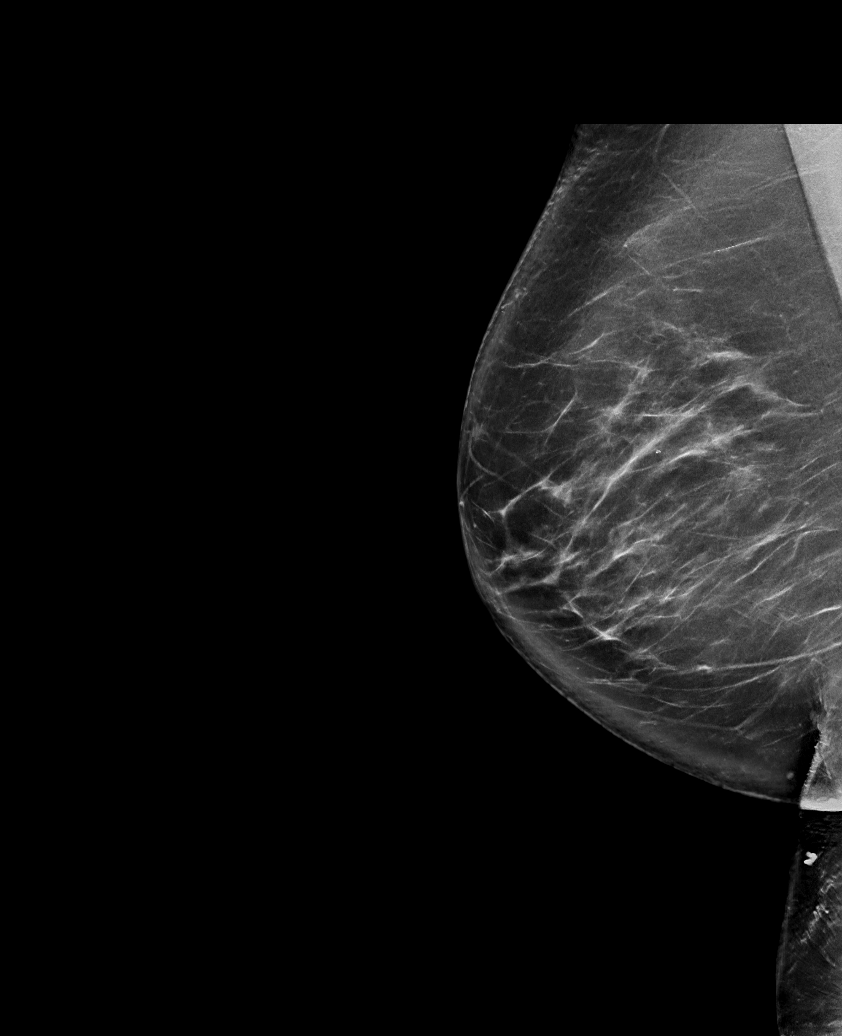

[L CC synth-2D]
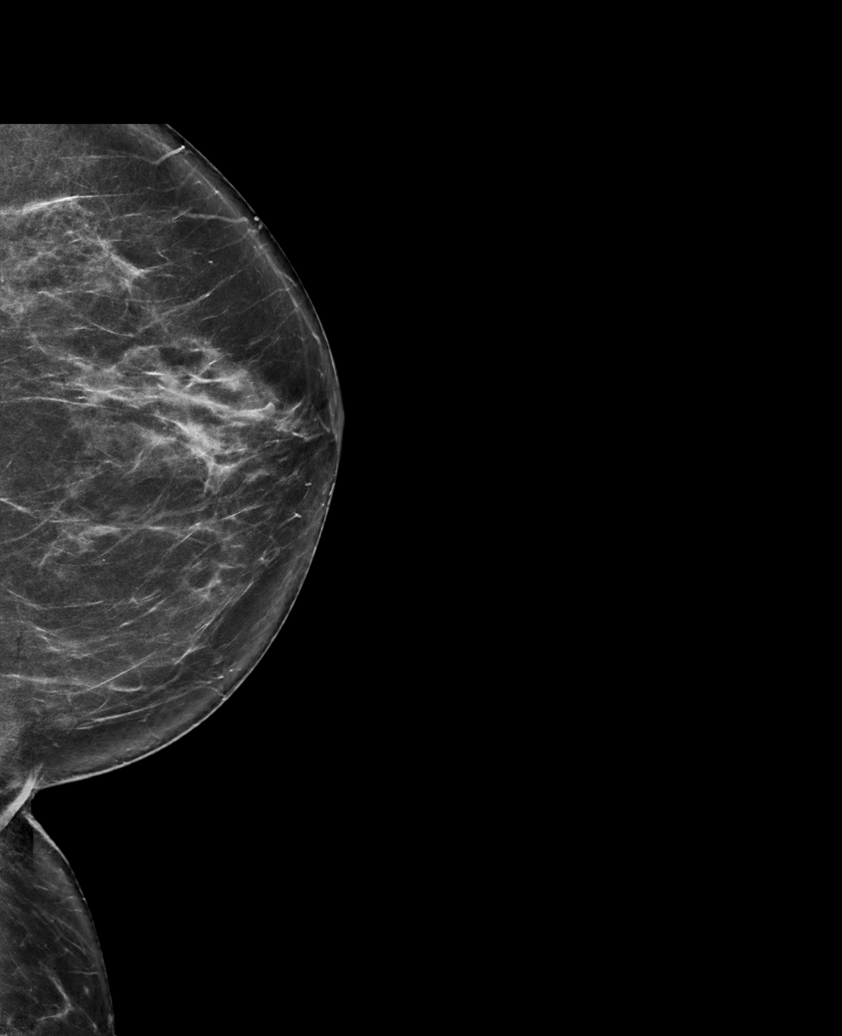

[R MLO synth-2D (2 of 2)]
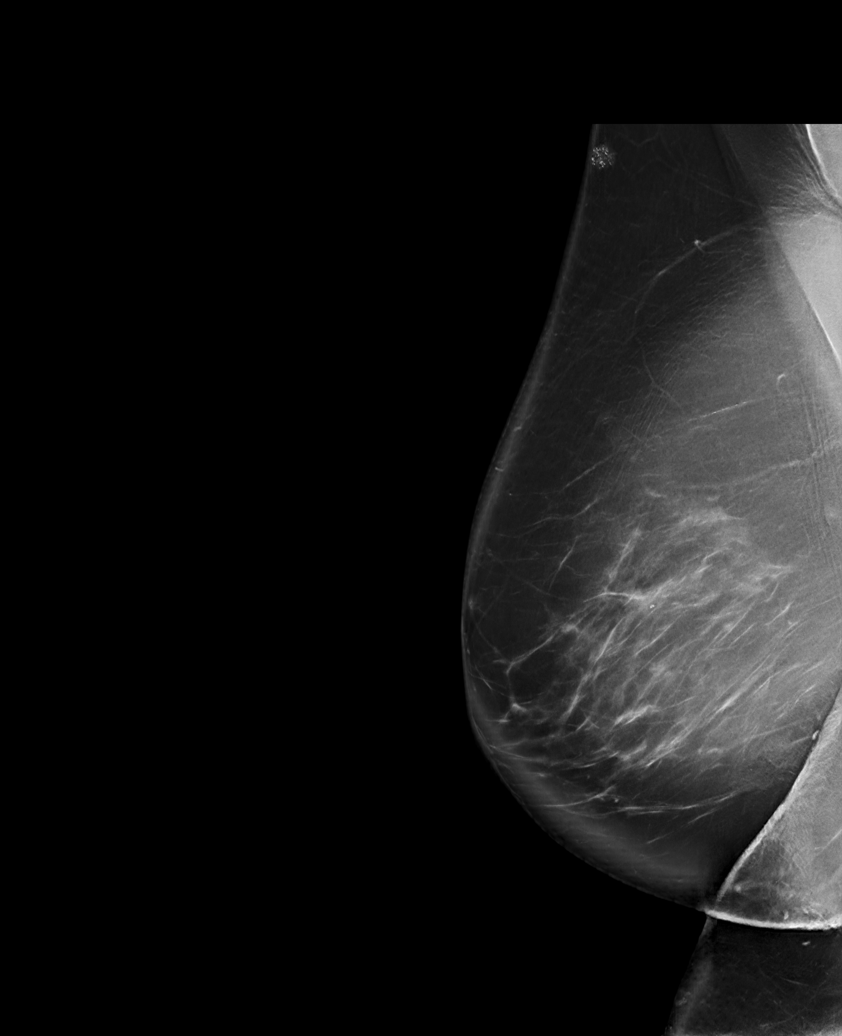

[R CC synth-2D]
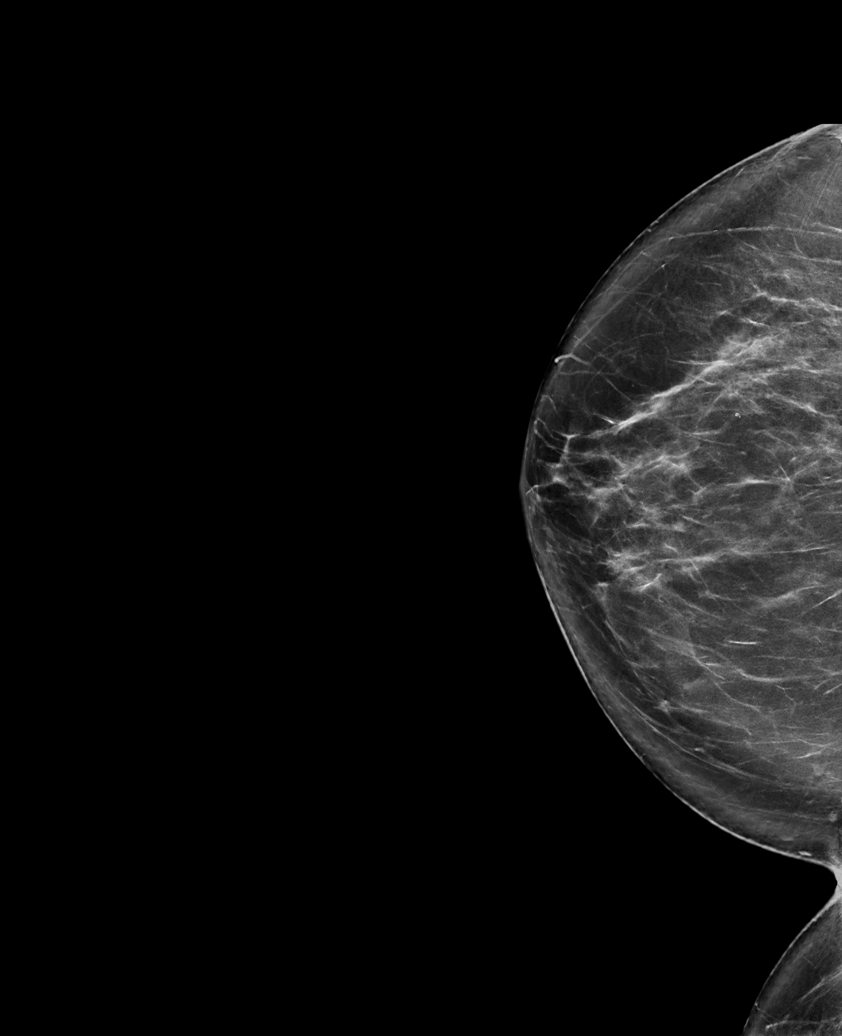

[L MLO synth-2D (1 of 2)]
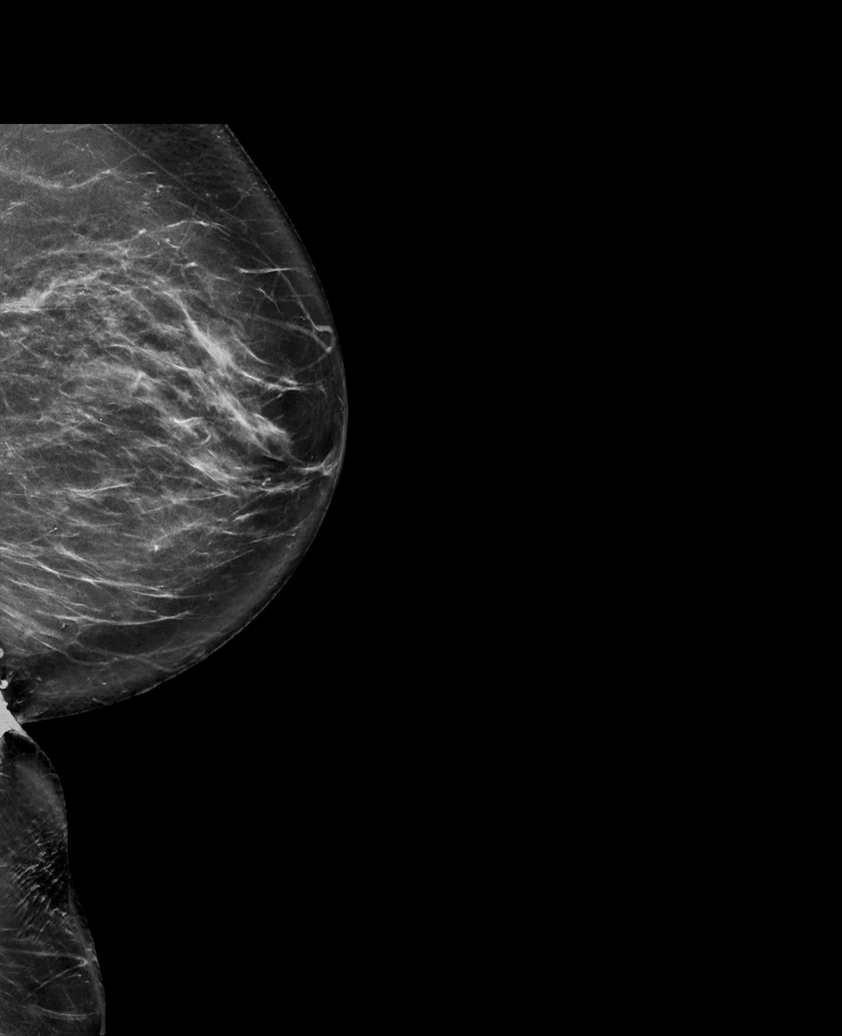

[L MLO synth-2D (2 of 2)]
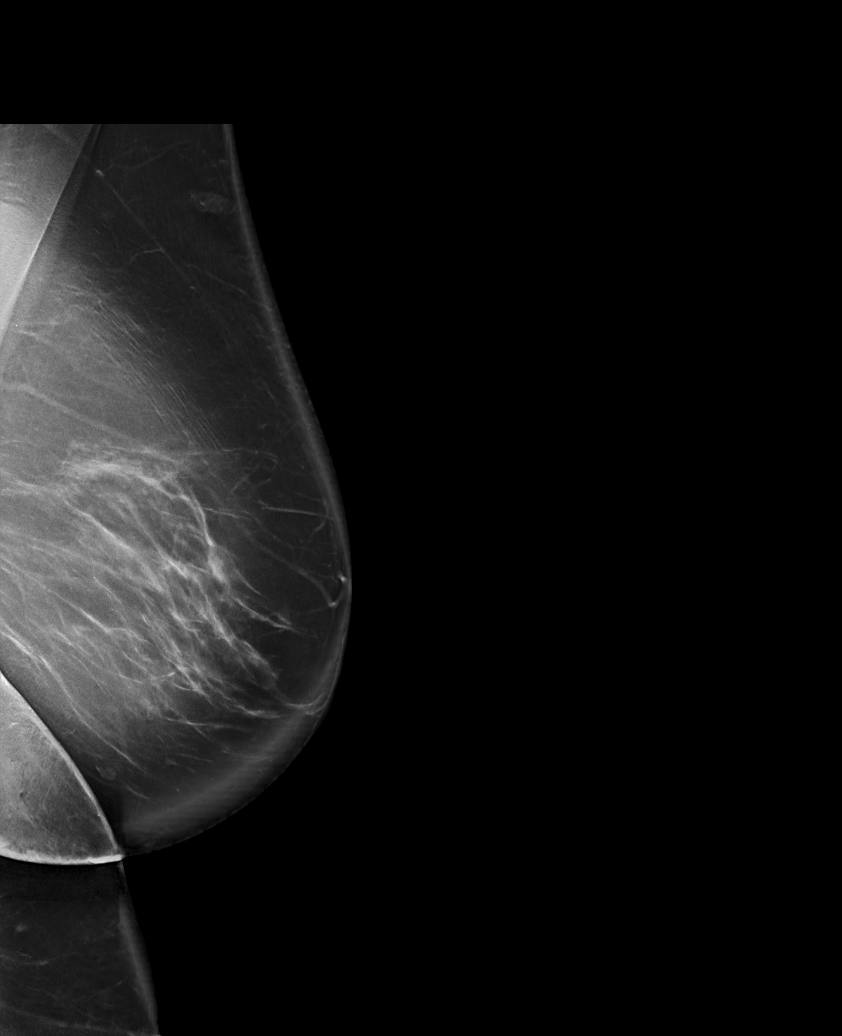

[6 of 36 positions shown; findings below may reference images not displayed]

ACR Breast Density Category b: There are scattered areas of
fibroglandular density.
FINDINGS: There are no findings suspicious for malignancy.
IMPRESSION: No mammographic evidence of malignancy. A result letter of this
screening mammogram will be mailed directly to the patient.

RECOMMENDATION:
Screening mammogram in one year. (Code:51-O-LD2)

BI-RADS CATEGORY  1: Negative.

## 2023-02-01 ENCOUNTER — Emergency Department (HOSPITAL_BASED_OUTPATIENT_CLINIC_OR_DEPARTMENT_OTHER)
Admission: EM | Admit: 2023-02-01 | Discharge: 2023-02-01 | Disposition: A | Discharge status: Home / Self Care | Payer: Medicare PPO | Attending: Emergency Medicine | Admitting: Emergency Medicine

## 2023-02-01 ENCOUNTER — Other Ambulatory Visit: Payer: Self-pay

## 2023-02-01 ENCOUNTER — Encounter (HOSPITAL_BASED_OUTPATIENT_CLINIC_OR_DEPARTMENT_OTHER): Payer: Self-pay | Admitting: Pediatrics

## 2023-02-01 DIAGNOSIS — G500 Trigeminal neuralgia: Secondary | ICD-10-CM | POA: Diagnosis not present

## 2023-02-01 DIAGNOSIS — R200 Anesthesia of skin: Secondary | ICD-10-CM | POA: Insufficient documentation

## 2023-02-01 LAB — BASIC METABOLIC PANEL
Anion gap: 8 (ref 5–15)
BUN: 16 mg/dL (ref 8–23)
CO2: 26 mmol/L (ref 22–32)
Calcium: 8.9 mg/dL (ref 8.9–10.3)
Chloride: 104 mmol/L (ref 98–111)
Creatinine, Ser: 0.66 mg/dL (ref 0.44–1.00)
GFR, Estimated: >60 mL/min (ref >60)
Glucose, Bld: 123 mg/dL — ABNORMAL HIGH (ref 70–99)
Potassium: 3.6 mmol/L (ref 3.5–5.1)
Sodium: 138 mmol/L (ref 135–145)

## 2023-02-01 LAB — CBC
HCT: 37.6 % (ref 36.0–46.0)
Hemoglobin: 12.2 g/dL (ref 12.0–15.0)
MCH: 29.2 pg (ref 26.0–34.0)
MCHC: 32.4 g/dL (ref 30.0–36.0)
MCV: 90 fL (ref 80.0–100.0)
Platelets: 248 10*3/uL (ref 150–400)
RBC: 4.18 MIL/uL (ref 3.87–5.11)
RDW: 15.8 % — ABNORMAL HIGH (ref 11.5–15.5)
WBC: 9 10*3/uL (ref 4.0–10.5)
nRBC: 0 % (ref 0.0–0.2)

## 2023-02-01 LAB — TROPONIN I (HIGH SENSITIVITY): Troponin I (High Sensitivity): 3 ng/L (ref <18)

## 2023-02-01 NOTE — ED Provider Notes (Signed)
[place] [facility] Provider Note   CSN: [id number] Arrival date & time: 02/01/23  1842     History Chief Complaint  Patient presents with   Numbness    HPI Savannah Tran is a 77 y.o. female presenting for numbness of the face. She states that between noon and 2 today she had a left-sided facial numbness.  She has had episodes similar to this recurrently over the past few months.  She typically has paroxysms of sudden onset left-sided numbness that recur over the course of hours and then resolved.  Her symptoms are all resolved at this time. She denies any other neurologic symptoms.  Denies fevers chills nausea vomiting syncope shortness of breath.  Otherwise ambulatory tolerating p.o. intake.   Patient's recorded medical, surgical, social, medication list and allergies were reviewed in the Snapshot window as part of the initial history.   Review of Systems   Review of Systems  Constitutional:  Negative for chills and fever.  HENT:  Negative for ear pain and sore throat.   Eyes:  Negative for pain and visual disturbance.  Respiratory:  Negative for cough and shortness of breath.   Cardiovascular:  Negative for chest pain and palpitations.  Gastrointestinal:  Negative for abdominal pain and vomiting.  Genitourinary:  Negative for dysuria and hematuria.  Musculoskeletal:  Negative for arthralgias and back pain.  Skin:  Negative for color change and rash.  Neurological:  Positive for numbness. Negative for seizures and syncope.  All other systems reviewed and are negative.   Physical Exam Updated Vital Signs BP (!) 175/96   Pulse 65   Temp 97.6 F (36.4 C) (Oral)   Resp 18   Ht  (1.575 m)   Wt 73.5 kg   SpO2 98%   BMI 29.63 kg/m  Physical Exam Vitals and nursing note reviewed.  Constitutional:      General: She is not in acute distress.    Appearance: She is well-developed.  HENT:     Head: Normocephalic and  atraumatic.  Eyes:     Conjunctiva/sclera: Conjunctivae normal.  Cardiovascular:     Rate and Rhythm: Normal rate and regular rhythm.     Heart sounds: No murmur heard. Pulmonary:     Effort: Pulmonary effort is normal. No respiratory distress.     Breath sounds: Normal breath sounds.  Abdominal:     General: There is no distension.     Palpations: Abdomen is soft.     Tenderness: There is no abdominal tenderness. There is no right CVA tenderness or left CVA tenderness.  Musculoskeletal:        General: No swelling or tenderness. Normal range of motion.     Cervical back: Neck supple.  Skin:    General: Skin is warm and dry.  Neurological:     General: No focal deficit present.     Mental Status: She is alert and oriented to person, place, and time. Mental status is at baseline.     Cranial Nerves: No cranial nerve deficit.      ED Course/ Medical Decision Making/ A&P    Procedures Procedures   Medications Ordered in ED Medications - No data to display  Medical Decision Making:    Savannah Tran is a 77 y.o. female who presented to the ED today with episodic facial numbness currently not present detailed above.     Complete initial physical exam performed, notably the patient  was HDS in  NAD.      Reviewed and confirmed nursing documentation for past medical history, family history, social history.    Initial Assessment:   Patient presentation of left jaw discomfort is most consistent with peripheral neurologic etiology.  She recently had Mohs surgery on her left temple so possible neurologic inflammation from the source.  There is a subset of patients that present with trigeminal neuralgia with numbness as the primary complaint.  I discussed this with the patient and she agrees that this is possible as well. However given her family history cardiac abnormality, an EKG, troponin were drawn with no focal pathology.  Screening labs were performed with no other  focal pathology identified. Patient was observed in the emergency room for a total of 4 hours with maintained symptomatic resolution throughout this duration. She does not need any further neurologic evaluation at this time given resolution of symptoms.  Will refer to neurology in the outpatient setting Disposition:  I have considered need for hospitalization, however, considering all of the above, I believe this patient is stable for discharge at this time.  Patient/family educated about specific return precautions for given chief complaint and symptoms.  Patient/family educated about follow-up with PCP.     Patient/family expressed understanding of return precautions and need for follow-up. Patient spoken to regarding all imaging and laboratory results and appropriate follow up for these results. All education provided in verbal form with additional information in written form. Time was allowed for answering of patient questions. Patient discharged.    Emergency Department Medication Summary:   Medications - No data to display      Clinical Impression:  1. Numbness   2. Trigeminal neuralgia      Discharge   Final Clinical Impression(s) / ED Diagnoses Final diagnoses:  Numbness  Trigeminal neuralgia    Rx / DC Orders ED Discharge Orders          Ordered    Ambulatory referral to Neurology       Comments: An appointment is requested in approximately: 1 week   02/01/23 2250              Glyn Ade, MD 02/01/23 2340

## 2023-02-01 NOTE — ED Triage Notes (Signed)
C/O numbness on left temporal side of face radiating down the jaw line, started around 2 in the afternoon. Denies unilateral weakness, -SOB, -palpitations and or chest pain.

## 2023-04-06 ENCOUNTER — Encounter: Payer: Self-pay | Admitting: Neurology

## 2023-04-06 ENCOUNTER — Ambulatory Visit: Payer: Medicare PPO | Admitting: Neurology

## 2023-04-06 VITALS — BP 144/59 | HR 68 | Ht 63.0 in | Wt 160.0 lb

## 2023-04-06 DIAGNOSIS — R200 Anesthesia of skin: Secondary | ICD-10-CM | POA: Diagnosis not present

## 2023-04-06 NOTE — Patient Instructions (Signed)
Stroke Prevention Some medical conditions and behaviors can lead to a higher chance of having a stroke. You can help prevent a stroke by eating healthy, exercising, not smoking, and managing any medical conditions you have. Stroke is a leading cause of functional impairment. Primary prevention is particularly important because a majority of strokes are first-time events. Stroke changes the lives of not only those who experience a stroke but also their family and other caregivers. How can this condition affect me? A stroke is a medical emergency and should be treated right away. A stroke can lead to brain damage and can sometimes be life-threatening. If a person gets medical treatment right away, there is a better chance of surviving and recovering from a stroke. What can increase my risk? The following medical conditions may increase your risk of a stroke: Cardiovascular disease. High blood pressure (hypertension). Diabetes. High cholesterol. Sickle cell disease. Blood clotting disorders (hypercoagulable state). Obesity. Sleep disorders (obstructive sleep apnea). Other risk factors include: Being older than age 60. Having a history of blood clots, stroke, or mini-stroke (transient ischemic attack, TIA). Genetic factors, such as race, ethnicity, or a family history of stroke. Smoking cigarettes or using other tobacco products. Taking birth control pills, especially if you also use tobacco. Heavy use of alcohol or drugs, especially cocaine and methamphetamine. Physical inactivity. What actions can I take to prevent this? Manage your health conditions High cholesterol levels. Eating a healthy diet is important for preventing high cholesterol. If cholesterol cannot be managed through diet alone, you may need to take medicines. Take any prescribed medicines to control your cholesterol as told by your health care provider. Hypertension. To reduce your risk of stroke, try to keep your blood  pressure below 130/80. Eating a healthy diet and exercising regularly are important for controlling blood pressure. If these steps are not enough to manage your blood pressure, you may need to take medicines. Take any prescribed medicines to control hypertension as told by your health care provider. Ask your health care provider if you should monitor your blood pressure at home. Have your blood pressure checked every year, even if your blood pressure is normal. Blood pressure increases with age and some medical conditions. Diabetes. Eating a healthy diet and exercising regularly are important parts of managing your blood sugar (glucose). If your blood sugar cannot be managed through diet and exercise, you may need to take medicines. Take any prescribed medicines to control your diabetes as told by your health care provider. Get evaluated for obstructive sleep apnea. Talk to your health care provider about getting a sleep evaluation if you snore a lot or have excessive sleepiness. Make sure that any other medical conditions you have, such as atrial fibrillation or atherosclerosis, are managed. Nutrition Follow instructions from your health care provider about what to eat or drink to help manage your health condition. These instructions may include: Reducing your daily calorie intake. Limiting how much salt (sodium) you use to 1,500 milligrams (mg) each day. Using only healthy fats for cooking, such as olive oil, canola oil, or sunflower oil. Eating healthy foods. You can do this by: Choosing foods that are high in fiber, such as whole grains, and fresh fruits and vegetables. Eating at least 5 servings of fruits and vegetables a day. Try to fill one-half of your plate with fruits and vegetables at each meal. Choosing lean protein foods, such as lean cuts of meat, poultry without skin, fish, tofu, beans, and nuts. Eating low-fat dairy products. Avoiding   foods that are high in sodium. This can help  lower blood pressure. Avoiding foods that have saturated fat, trans fat, and cholesterol. This can help prevent high cholesterol. Avoiding processed and prepared foods. Counting your daily carbohydrate intake.  Lifestyle If you drink alcohol: Limit how much you have to: 0-1 drink a day for women who are not pregnant. 0-2 drinks a day for men. Know how much alcohol is in your drink. In the [place] one drink equals one 12 oz bottle of beer (355mL), one 5 oz glass of wine (148mL), or one 1 oz glass of hard liquor (44mL). Do not use any products that contain nicotine or tobacco. These products include cigarettes, chewing tobacco, and vaping devices, such as e-cigarettes. If you need help quitting, ask your health care provider. Avoid secondhand smoke. Do not use drugs. Activity  Try to stay at a healthy weight. Get at least 30 minutes of exercise on most days, such as: Fast walking. Biking. Swimming. Medicines Take over-the-counter and prescription medicines only as told by your health care provider. Aspirin or blood thinners (antiplatelets or anticoagulants) may be recommended to reduce your risk of forming blood clots that can lead to stroke. Avoid taking birth control pills. Talk to your health care provider about the risks of taking birth control pills if: You are over 35 years old. You smoke. You get very bad headaches. You have had a blood clot. Where to find more information American Stroke Association: www.strokeassociation.org Get help right away if: You or a loved one has any symptoms of a stroke. "BE FAST" is an easy way to remember the main warning signs of a stroke: B - Balance. Signs are dizziness, sudden trouble walking, or loss of balance. E - Eyes. Signs are trouble seeing or a sudden change in vision. F - Face. Signs are sudden weakness or numbness of the face, or the face or eyelid drooping on one side. A - Arms. Signs are weakness or numbness in an arm. This happens  suddenly and usually on one side of the body. S - Speech. Signs are sudden trouble speaking, slurred speech, or trouble understanding what people say. T - Time. Time to call emergency services. Write down what time symptoms started. You or a loved one has other signs of a stroke, such as: A sudden, severe headache with no known cause. Nausea or vomiting. Seizure. These symptoms may represent a serious problem that is an emergency. Do not wait to see if the symptoms will go away. Get medical help right away. Call your local emergency services (911 in the [place] Do not drive yourself to the hospital. Summary You can help to prevent a stroke by eating healthy, exercising, not smoking, limiting alcohol intake, and managing any medical conditions you may have. Do not use any products that contain nicotine or tobacco. These include cigarettes, chewing tobacco, and vaping devices, such as e-cigarettes. If you need help quitting, ask your health care provider. Remember "BE FAST" for warning signs of a stroke. Get help right away if you or a loved one has any of these signs. This information is not intended to replace advice given to you by your health care provider. Make sure you discuss any questions you have with your health care provider. Document Revised: 09/05/2022 Document Reviewed: 09/05/2022 Elsevier Patient Education  2024 Elsevier Inc.  

## 2023-04-06 NOTE — Progress Notes (Signed)
[id number]    Provider:  Dr Lucia Gaskins Requesting Provider: Glyn Ade, MD Primary Care Provider:  Roberts Gaudy., MD  CC:  Left-sided numbness of face  HPI:  Savannah Tran is a 78 y.o. female here as requested by Glyn Ade, MD for numbness of face. has Atrial fibrillation with rapid ventricular response (HCC); Gastroesophageal reflux disease; Hiatal hernia; History of kidney stones; and Left facial numbness on their problem list.  Facial numbness started before Moh's surgery, 6 months ago, would come and go, along the side of the face from temple to the jawline. She always assumed it wasn;t serious. The day she went to the ED because it became more persistent. 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 But may have started after the freezing. The ED did not do anything or brain imaging. No pain. Just numbness. No symptoms of stroke. She was diagnosed with trigeminal neuralgia but she has no pain at all. The numbness is improving, she hasn't had it in a few weeks. They gave her a cardiac exam because she really worried about that. She did not have brain imaging.  Reviewed notes, labs and imaging from outside physicians, which showed:     Latest Ref Rng & Units 02/01/2023    6:53 PM 09/27/2011   10:35 AM  CMP  Glucose 70 - 99 mg/dL 782  97   BUN 8 - 23 mg/dL 16  13   Creatinine 9.56 - 1.00 mg/dL 2.13  0.86   Sodium 578 - 145 mmol/L 138  140   Potassium 3.5 - 5.1 mmol/L 3.6  3.7   Chloride 98 - 111 mmol/L 104  103   CO2 22 - 32 mmol/L 26  27   Calcium 8.9 - 10.3 mg/dL 8.9  9.9       Latest Ref Rng & Units 02/01/2023    6:53 PM 09/27/2011   10:35 AM  CBC  WBC 4.0 - 10.5 K/uL 9.0  6.5   Hemoglobin 12.0 - 15.0 g/dL 46.9  62.9   Hematocrit 36.0 -  46.0 % 37.6  39.0   Platelets 150 - 400 K/uL 248  224      Review of Systems: Patient complains of symptoms per HPI as well as the following symptoms numbness left side of face. Pertinent negatives and positives per HPI. All others negative.   Social History   Socioeconomic History   Marital status: Married    Spouse name: Not on file   Number of children: Not on file   Years of education: Not on file   Highest education level: Not on file  Occupational History   Not on file  Tobacco Use   Smoking status: Never   Smokeless tobacco: Never  Substance and Sexual Activity   Alcohol use: No   Drug use: No   Sexual activity: Not Currently  Other Topics Concern   Not on file  Social History Narrative   Not on file   Social Determinants of Health   Financial Resource Strain: Not on file  Food Insecurity: Not on file  Transportation Needs: Not on file  Physical Activity: Not on file  Stress: Not on file  Social Connections: Not on file  Intimate Partner Violence: Not on file    Family History  Problem Relation Age of Onset   Heart disease Father    Skin cancer Father    Heart attack Mother    Breast cancer Maternal Aunt 54    Past Medical History:  Diagnosis Date   Anxiety    no meds   Atrial fibrillation with rapid ventricular response (HCC)    R/T HCTZ  med reaction/ALLERGIC TO MED   Chronic kidney disease    lithotripsy - kidney stones   Gastroesophageal reflux disease    Headache(784.0)    history - non since menopausal   Hiatal hernia    History of kidney stones    Hyperlipidemia    Hypertension     Patient Active Problem List   Diagnosis Date Noted   Left facial numbness 04/06/2023   Atrial fibrillation with rapid ventricular response (HCC)    Gastroesophageal reflux disease    Hiatal hernia    History of kidney stones     Past Surgical History:  Procedure Laterality Date   BREAST BIOPSY Left    CHOLECYSTECTOMY     colonscopy  04/2005    CYSTOSCOPY W/ LITHOLAPAXY / [facility]     fluid sac  2000   surgical removal of fluid sac at incision site from gall bladder surgery   GALLBLADDER SURGERY  1995 or 1996   HYSTEROSCOPY WITH D & C  09/30/2011   Procedure: DILATATION AND CURETTAGE /HYSTEROSCOPY;  Surgeon: Loney Laurence;  Location: [facility];  Service: Gynecology;  Laterality: N/A;   LAPAROSCOPY  09/30/2011   Procedure: LAPAROSCOPY DIAGNOSTIC;  Surgeon: Loney Laurence;  Location: [facility];  Service: Gynecology;;   SVD  1974   x 1   UPPER GASTROINTESTINAL ENDOSCOPY  2000   WISDOM TOOTH EXTRACTION      Current Outpatient Medications  Medication Sig Dispense Refill   acetaminophen (TYLENOL) 325 MG tablet Take 650 mg by mouth every 6 (six) hours as needed. For pain      amLODipine (NORVASC) 10 MG tablet Take 10 mg by mouth daily.     aspirin 81 MG tablet Take 81 mg by mouth daily. Taking 2 at HS     atorvastatin (LIPITOR) 10 MG tablet Take 10 mg by mouth daily.       fluticasone (VERAMYST) 27.5 MCG/SPRAY nasal spray Place 2 sprays into the nose daily.       lisinopril (PRINIVIL,ZESTRIL) 5 MG tablet Take 5 mg by mouth daily.       loratadine (CLARITIN) 10 MG tablet Take 10 mg by mouth daily as needed. For cold symptoms      metoprolol (TOPROL-XL) 100 MG 24 hr tablet Take 100 mg by mouth daily.       Multiple Vitamin (MULTIVITAMIN) tablet Take 1 tablet by mouth daily.       pantoprazole (PROTONIX) 40 MG tablet Take 40 mg by mouth 2 (two) times daily.     No current facility-administered medications for this visit.    Allergies as of 04/06/2023 - Review Complete 04/06/2023  Allergen Reaction Noted   Terazosin Other (See Comments) 01/23/2017   Erythromycin  04/08/2011   Hctz [hydrochlorothiazide] Palpitations 09/22/2011    Vitals: BP (!) 144/59   Pulse 68   Ht 5\' 3"  (1.6 m)   Wt 160 lb (72.6 kg)   BMI 28.34 kg/m  Last Weight:  Wt Readings from Last 1 Encounters:  04/06/23 160 lb (72.6 kg)   Last Height:   Ht  Readings from Last 1 Encounters:  04/06/23 5\' 3"  (  1.6 m)     Physical exam: Exam: Gen: NAD, conversant, well nourised, well groomed                     CV: RRR, no MRG. No Carotid Bruits. No peripheral edema, warm, nontender Eyes: Conjunctivae clear without exudates or hemorrhage  Neuro: Detailed Neurologic Exam  Speech:    Speech is normal; fluent and spontaneous with normal comprehension.  Cognition:    The patient is oriented to person, place, and time;     recent and remote memory intact;     language fluent;     normal attention, concentration,     fund of knowledge Cranial Nerves:    The pupils are equal, round, and reactive to light. No ONH edema. Visual fields are full to finger confrontation. Extraocular movements are intact. Trigeminal sensation is intact and the muscles of mastication are normal. The face is symmetric. The palate elevates in the midline. Hearing intact. Voice is normal. Shoulder shrug is normal. The tongue has normal motion without fasciculations.   Coordination: nml  Gait: nml  Motor Observation:    No asymmetry, no atrophy, and no involuntary movements noted. Tone:    Normal muscle tone.    Posture:    Posture is normal. normal erect    Strength:    Strength is V/V in the upper and lower limbs.      Sensation: intact to LT     Reflex Exam:  DTR's:    Absent AJs, 2+ patellars and biceps  Toes:    The toes are equiv bilaterally.   Clonus:    Clonus is absent.    Assessment/Plan:  Savannah Tran is a 77 y.o. female here as requested by Glyn Ade, MD for numbness of face. has Atrial fibrillation with rapid ventricular response (HCC); Gastroesophageal reflux disease; Hiatal hernia; and History of kidney stones on their problem list.  Facial numbness started before Moh's surgery but possibly after she had a freezing procedure, 6 months ago, would come and go, along the side of the face from temple to the jawline.  She  is a "skin cancer waiting to happen" per patient and she had the spot frozen(numbness may have started then) before she had moh's surgery at temple left where the numbness starts. But may have started after the freezing. The ED did not do brain imaging. No pain. Just numbness. No other symptoms of stroke. She was diagnosed with trigeminal neuralgia but she has no pain at all. The numbness is improving, she hasn't had it in a few weeks. They gave her a cardiac exam because she really worried about that.   Unusual for TGN, no pain just some numbness from the temple down the side of the face to the jawline may have been from procedure at dermatologist in the same location but should check MRI brain for stroke. She declines but will think about it. Exam non focal.    Cc: Glyn Ade, MD,  Roberts Gaudy., MD  Naomie Dean, MD  [facility] [address] [place], [place] [address]  Phone [phone] Fax [phone]  I spent 30 minutes of face-to-face and non-face-to-face time with patient on the  1. Left facial numbness    diagnosis.  This included previsit chart review, lab review, study review, order entry, electronic health record documentation, patient education on the different diagnostic and therapeutic options, counseling and coordination of care, risks and benefits of management, compliance, or  risk factor reduction

## 2023-10-09 ENCOUNTER — Other Ambulatory Visit: Payer: Self-pay | Admitting: Obstetrics and Gynecology

## 2023-10-09 DIAGNOSIS — Z1231 Encounter for screening mammogram for malignant neoplasm of breast: Secondary | ICD-10-CM

## 2023-11-23 ENCOUNTER — Ambulatory Visit: Payer: Medicare PPO
